# Patient Record
Sex: Female | Born: 1990 | Race: Black or African American | Hispanic: No | Marital: Single
Health system: Midwestern US, Community
[De-identification: ages and names within clinical notes are randomized; demographics above are authoritative.]

---

## 2010-06-02 LAB — URINALYSIS W/ RFLX MICROSCOPIC
Bilirubin: NEGATIVE
Blood: NEGATIVE
Glucose: NEGATIVE MG/DL
Ketone: NEGATIVE MG/DL
Nitrites: NEGATIVE
Protein: NEGATIVE MG/DL
Specific gravity: >1.03 — ABNORMAL HIGH (ref 1.003–1.030)
Urobilinogen: 0.2 EU/DL (ref 0.2–1.0)
pH (UA): 6 (ref 5.0–8.0)

## 2010-06-03 LAB — URINE MICROSCOPIC ONLY
RBC: NEGATIVE /HPF (ref 0–5)
WBC: 20 /HPF (ref 0–5)

## 2010-06-03 LAB — WET PREP

## 2010-06-06 LAB — CHLAMYDIA/GC PCR
Chlamydia amplified: NEGATIVE
N. gonorrhea, amplified: NEGATIVE

## 2010-08-11 LAB — URINALYSIS W/ RFLX MICROSCOPIC
Bilirubin: NEGATIVE
Blood: NEGATIVE
Glucose: NEGATIVE MG/DL
Ketone: NEGATIVE MG/DL
Leukocyte Esterase: NEGATIVE
Nitrites: NEGATIVE
Protein: NEGATIVE MG/DL
Specific gravity: 1.025 (ref 1.003–1.030)
Urobilinogen: 0.2 EU/DL (ref 0.2–1.0)
pH (UA): 7 (ref 5.0–8.0)

## 2010-08-11 LAB — WET PREP

## 2010-08-12 LAB — CHLAMYDIA/GC PCR
Chlamydia amplified: NEGATIVE
N. gonorrhea, amplified: NEGATIVE

## 2010-12-08 LAB — METABOLIC PANEL, BASIC
Anion gap: 8 mmol/L (ref 5–15)
BUN/Creatinine ratio: 10 — ABNORMAL LOW (ref 12–20)
BUN: 8 MG/DL (ref 7–18)
CO2: 24 MMOL/L (ref 21–32)
Calcium: 9 MG/DL (ref 8.4–10.4)
Chloride: 102 MMOL/L (ref 100–108)
Creatinine: 0.8 MG/DL (ref 0.6–1.3)
GFR est AA: >60 mL/min/{1.73_m2} (ref >60)
GFR est non-AA: >60 mL/min/{1.73_m2} (ref >60)
Glucose: 76 MG/DL (ref 74–99)
Potassium: 3.8 MMOL/L (ref 3.5–5.5)
Sodium: 134 MMOL/L — ABNORMAL LOW (ref 136–145)

## 2010-12-08 LAB — CBC WITH AUTOMATED DIFF
ABS. BASOPHILS: 0 10*3/uL (ref 0.0–0.06)
ABS. EOSINOPHILS: 0.3 10*3/uL (ref 0.0–0.4)
ABS. LYMPHOCYTES: 1.8 10*3/uL (ref 0.9–3.6)
ABS. MONOCYTES: 0.4 10*3/uL (ref 0.05–1.2)
ABS. NEUTROPHILS: 2.8 10*3/uL (ref 1.8–8.0)
BASOPHILS: 0 % (ref 0–2)
EOSINOPHILS: 6 % — ABNORMAL HIGH (ref 0–5)
HCT: 37.8 % (ref 35.0–45.0)
HGB: 12.3 g/dL (ref 12.0–16.0)
LYMPHOCYTES: 34 % (ref 21–52)
MCH: 26.8 PG (ref 24.0–34.0)
MCHC: 32.5 g/dL (ref 31.0–37.0)
MCV: 82.4 FL (ref 74.0–97.0)
MONOCYTES: 7 % (ref 3–10)
MPV: 9.2 FL (ref 9.2–11.8)
NEUTROPHILS: 53 % (ref 40–73)
PLATELET: 251 10*3/uL (ref 135–420)
RBC: 4.59 M/uL (ref 4.20–5.30)
RDW: 15 % — ABNORMAL HIGH (ref 11.6–14.5)
WBC: 5.3 10*3/uL (ref 4.6–13.2)

## 2010-12-08 LAB — PROTHROMBIN TIME + INR
INR: 1 (ref 0.0–1.2)
Prothrombin time: 13 s (ref 11.5–15.2)

## 2010-12-08 LAB — PTT: aPTT: 25 s (ref 24.6–37.7)

## 2010-12-08 LAB — BLOOD TYPE, (ABO+RH)
ABO/Rh(D): O POS
ABO/Rh: O POS

## 2010-12-08 LAB — BETA HCG, QT
Beta HCG, QT: 66214 m[IU]/mL — ABNORMAL HIGH (ref 1–6)
hCG Quant: 66214 m[IU]/mL — ABNORMAL HIGH (ref 1–6)

## 2010-12-08 NOTE — ED Provider Notes (Signed)
HPI Comments: 20 yo F presents to ED via C/O vaginal bleeding. Pt states she noticed some vaginal spotting approx 4 hours PTA while using the bathroom. Pt reports being gravid, LMP started 10/30/2010, G1P0A0. Pt describes some occasional pains in both flanks and her low back throughout pregnancy. Pt denies any focal abd pain. Pt denies having OB-Gyn visit or prior ultrasound. Pt reports taking pre-natal vitamins. Pt reports stopping smoking with pregnancy. Pt's Hx includes asthma. Pt denies dysuria, N/V/D and any other Sx or complaints.    Documented by Ronni Rumble, ED Scribe, for Amalia Greenhouse, MD      Patient is a 20 y.o. female presenting with non-menstrual bleeding. The history is provided by the patient.   Vaginal Bleeding  Primary symptoms include vaginal bleeding.  Primary symptoms include no discharge. There has been no fever. This is a new problem. The current episode started 3 to 5 hours ago. She is pregnant.        Past Medical History   Diagnosis Date   ??? Asthma         No past surgical history on file.      No family history on file.     History     Social History   ??? Marital Status: Single     Spouse Name: N/A     Number of Children: N/A   ??? Years of Education: N/A     Occupational History   ??? Not on file.     Social History Main Topics   ??? Smoking status: Former Smoker     Quit date: 09/22/2010   ??? Smokeless tobacco: Not on file    Comment: encouraged to svoid smoking    ??? Alcohol Use:    ??? Drug Use:    ??? Sexually Active: Yes -- Female partner(s)     Other Topics Concern   ??? Not on file     Social History Narrative   ??? No narrative on file                  ALLERGIES: Review of patient's allergies indicates no known allergies.      Review of Systems   Constitutional: Negative.    HENT: Negative.    Eyes: Negative.    Respiratory: Negative.    Cardiovascular: Negative.    Gastrointestinal: Negative.    Genitourinary: Positive for vaginal bleeding and non-menstrual bleeding.        [See HPI   Musculoskeletal: Positive for back pain.        [See HPI  Skin: Negative.    Neurological: Negative.    Hematological: Negative.    Psychiatric/Behavioral: Negative.    [all other systems reviewed and are negative    Documented by Ronni Rumble, ED Scribe, for Amalia Greenhouse, MD      Filed Vitals:    12/08/10 1530 12/08/10 1622   BP: 114/77    Pulse: 94    Temp: 98.2 ??F (36.8 ??C)    Resp: 16    Height:  5\' 2"  (1.575 m)   Weight:  120 lb (54.432 kg)   SpO2: 97%             Physical Exam   [nursing notereviewed.  Constitutional: She is oriented to person, place, and time. She appears well-developed and well-nourished. No distress.   HENT:   Head: Normocephalic and atraumatic.   Right Ear: External ear normal.   Left Ear: External ear normal.   Mouth/Throat: Oropharynx  is clear and moist.   Eyes: Conjunctivae and EOM are normal. Pupils are equal, round, and reactive to light.   Neck: Normal range of motion. Neck supple.   Cardiovascular: Normal rate, regular rhythm and normal heart sounds.    Pulmonary/Chest: Effort normal and breath sounds normal. No respiratory distress.   Abdominal: Soft. Bowel sounds are normal. There is no tenderness.   Genitourinary:        Brownish discharge noted. Cervix was closed.  There were no adnexal masses or pain.       Musculoskeletal: Normal range of motion. She exhibits no edema.   Neurological: She is alert and oriented to person, place, and time. No cranial nerve deficit.   Skin: Skin is warm and dry. No erythema.   Psychiatric: She has a normal mood and affect. Her behavior is normal.     Documented by Ronni Rumble, ED Scribe, for Amalia Greenhouse, MD       MDM     Amount and/or Complexity of Data Reviewed:   Clinical lab tests:  [ordered and reviewed  Tests in the radiology section of CPT??:  [ordered and reviewed    Documented by Ronni Rumble, ED Scribe, for Amalia Greenhouse, MD      Procedures    Answered patient questions regarding Tx.     6:28 PM   Procedure Note - Bedside Ultrasound:  6:28 PM  Performed by: Amalia Greenhouse, MD  US performed at beside to uterus. US showed approx 6 week fetus. Fetal heart tones visualized 120/min.   The procedure took 1-15 minutes, and pt tolerated well.   Written by Cristy Hilts, ED Scribe, as dictated by Amalia Greenhouse, MD  .    Procedure Note - Pelvic Exam:    Performed by: Amalia Greenhouse, MD  Pelvic exam was performed using bimanual and speculum. Further findings noted in physical exam.   The procedure took 1-15 minutes, and pt tolerated well. GC Chlamydia and Wet prep cultures obtained  Written by Cristy Hilts, ED Scribe, as dictated by Amalia Greenhouse, MD    DISCHARGE NOTE:   8:23 PM  Pt has been reexamined.  Patient has no new complaints, changes, or physical findings.  Care plan outlined and precautions discussed.  Results of labs and ultrasound were reviewed with the patient and her mother. All medications were reviewed with the patient. All of pt's questions and concerns were addressed. Patient was instructed and agrees to follow up with OB-GYN, as well as to return to the ED upon further deterioration. Patient is ready to go home.  Written by Cristy Hilts, ED Scribe, as dictated by Amalia Greenhouse, MD    .

## 2010-12-08 NOTE — ED Notes (Signed)
G1 P0 Ab0, LMP April 8th - normal. Positive pregnancy test at [facility] two days ago. Presents with complaint of low back discomfort and vaginal bleeding (spotting), using pad - not saturated.

## 2010-12-08 NOTE — ED Notes (Signed)
I have reviewed discharge instructions with the patient.  The patient verbalized understanding.  Patient armband removed and shredded

## 2010-12-09 LAB — CHLAMYDIA/GC PCR
Chlamydia amplified: NEGATIVE
N. gonorrhea, amplified: NEGATIVE

## 2010-12-09 LAB — WET PREP

## 2016-11-21 ENCOUNTER — Emergency Department (HOSPITAL_COMMUNITY)
Admission: EM | Admit: 2016-11-21 | Discharge: 2016-11-21 | Disposition: A | Discharge status: Home / Self Care | Payer: No Typology Code available for payment source | Source: Home / Self Care | Attending: Emergency Medicine | Admitting: Emergency Medicine

## 2016-11-21 ENCOUNTER — Emergency Department (HOSPITAL_COMMUNITY): Payer: No Typology Code available for payment source

## 2016-11-21 ENCOUNTER — Emergency Department (HOSPITAL_COMMUNITY)
Admission: EM | Admit: 2016-11-21 | Discharge: 2016-11-21 | Disposition: A | Discharge status: Home / Self Care | Payer: No Typology Code available for payment source | Attending: Emergency Medicine | Admitting: Emergency Medicine

## 2016-11-21 ENCOUNTER — Encounter (HOSPITAL_COMMUNITY): Payer: Self-pay | Admitting: Radiology

## 2016-11-21 ENCOUNTER — Encounter (HOSPITAL_COMMUNITY): Payer: Self-pay | Admitting: Emergency Medicine

## 2016-11-21 DIAGNOSIS — Z23 Encounter for immunization: Secondary | ICD-10-CM | POA: Insufficient documentation

## 2016-11-21 DIAGNOSIS — Z3A26 26 weeks gestation of pregnancy: Secondary | ICD-10-CM

## 2016-11-21 DIAGNOSIS — S060X0A Concussion without loss of consciousness, initial encounter: Secondary | ICD-10-CM | POA: Diagnosis not present

## 2016-11-21 DIAGNOSIS — Y9389 Activity, other specified: Secondary | ICD-10-CM

## 2016-11-21 DIAGNOSIS — R791 Abnormal coagulation profile: Secondary | ICD-10-CM | POA: Insufficient documentation

## 2016-11-21 DIAGNOSIS — Z3A19 19 weeks gestation of pregnancy: Secondary | ICD-10-CM | POA: Insufficient documentation

## 2016-11-21 DIAGNOSIS — O9A212 Injury, poisoning and certain other consequences of external causes complicating pregnancy, second trimester: Secondary | ICD-10-CM

## 2016-11-21 DIAGNOSIS — Y999 Unspecified external cause status: Secondary | ICD-10-CM | POA: Insufficient documentation

## 2016-11-21 DIAGNOSIS — S86911A Strain of unspecified muscle(s) and tendon(s) at lower leg level, right leg, initial encounter: Secondary | ICD-10-CM | POA: Diagnosis not present

## 2016-11-21 DIAGNOSIS — Y92410 Unspecified street and highway as the place of occurrence of the external cause: Secondary | ICD-10-CM | POA: Insufficient documentation

## 2016-11-21 DIAGNOSIS — R55 Syncope and collapse: Secondary | ICD-10-CM

## 2016-11-21 DIAGNOSIS — S40812A Abrasion of left upper arm, initial encounter: Secondary | ICD-10-CM | POA: Diagnosis not present

## 2016-11-21 LAB — COMPREHENSIVE METABOLIC PANEL
ALK PHOS: 68 U/L (ref 38–126)
ALT: 17 U/L (ref 14–54)
ANION GAP: 7 (ref 5–15)
AST: 27 U/L (ref 15–41)
Albumin: 3.1 g/dL — ABNORMAL LOW (ref 3.5–5.0)
BUN: <5 mg/dL — ABNORMAL LOW (ref 6–20)
CALCIUM: 8.8 mg/dL — AB (ref 8.9–10.3)
CHLORIDE: 105 mmol/L (ref 101–111)
CO2: 23 mmol/L (ref 22–32)
Creatinine, Ser: 0.64 mg/dL (ref 0.44–1.00)
Glucose, Bld: 110 mg/dL — ABNORMAL HIGH (ref 65–99)
Potassium: 3.4 mmol/L — ABNORMAL LOW (ref 3.5–5.1)
Sodium: 135 mmol/L (ref 135–145)
Total Bilirubin: 0.4 mg/dL (ref 0.3–1.2)
Total Protein: 6.1 g/dL — ABNORMAL LOW (ref 6.5–8.1)

## 2016-11-21 LAB — CBC
HCT: 33.7 % — ABNORMAL LOW (ref 36.0–46.0)
Hemoglobin: 10.7 g/dL — ABNORMAL LOW (ref 12.0–15.0)
MCH: 28 pg (ref 26.0–34.0)
MCHC: 31.8 g/dL (ref 30.0–36.0)
MCV: 88.2 fL (ref 78.0–100.0)
Platelets: 250 10*3/uL (ref 150–400)
RBC: 3.82 MIL/uL — AB (ref 3.87–5.11)
RDW: 14.3 % (ref 11.5–15.5)
WBC: 6.8 10*3/uL (ref 4.0–10.5)

## 2016-11-21 LAB — PROTIME-INR
INR: 0.94
PROTHROMBIN TIME: 12.5 s (ref 11.4–15.2)

## 2016-11-21 LAB — CBG MONITORING, ED: Glucose-Capillary: 94 mg/dL (ref 65–99)

## 2016-11-21 LAB — I-STAT CHEM 8, ED
BUN: <3 mg/dL — ABNORMAL LOW (ref 6–20)
CALCIUM ION: 1.15 mmol/L (ref 1.15–1.40)
CREATININE: 0.6 mg/dL (ref 0.44–1.00)
Chloride: 103 mmol/L (ref 101–111)
GLUCOSE: 106 mg/dL — AB (ref 65–99)
HCT: 32 % — ABNORMAL LOW (ref 36.0–46.0)
Hemoglobin: 10.9 g/dL — ABNORMAL LOW (ref 12.0–15.0)
POTASSIUM: 3.5 mmol/L (ref 3.5–5.1)
Sodium: 137 mmol/L (ref 135–145)
TCO2: 23 mmol/L (ref 0–100)

## 2016-11-21 LAB — URINALYSIS, ROUTINE W REFLEX MICROSCOPIC
BILIRUBIN URINE: NEGATIVE
Bacteria, UA: NONE SEEN
GLUCOSE, UA: NEGATIVE mg/dL
HGB URINE DIPSTICK: NEGATIVE
Ketones, ur: NEGATIVE mg/dL
NITRITE: NEGATIVE
PH: 6 (ref 5.0–8.0)
Protein, ur: NEGATIVE mg/dL
SPECIFIC GRAVITY, URINE: 1.01 (ref 1.005–1.030)

## 2016-11-21 LAB — ETHANOL

## 2016-11-21 LAB — I-STAT CG4 LACTIC ACID, ED: Lactic Acid, Venous: 1.99 mmol/L (ref 0.5–1.9)

## 2016-11-21 LAB — ABO/RH: ABO/RH(D): O POS

## 2016-11-21 LAB — CDS SEROLOGY

## 2016-11-21 MED ORDER — FENTANYL CITRATE (PF) 100 MCG/2ML IJ SOLN
INTRAMUSCULAR | Status: AC | PRN
  Administered 2016-11-21: 25 ug via INTRAVENOUS

## 2016-11-21 MED ORDER — ACETAMINOPHEN 325 MG PO TABS
650.0000 mg | ORAL_TABLET | Freq: Once | ORAL | Status: AC
  Administered 2016-11-21: 650 mg via ORAL
  Filled 2016-11-21: qty 2

## 2016-11-21 MED ORDER — FENTANYL CITRATE (PF) 100 MCG/2ML IJ SOLN
25.0000 ug | Freq: Once | INTRAMUSCULAR | Status: AC
  Administered 2016-11-21: 25 ug via INTRAVENOUS
  Filled 2016-11-21: qty 2

## 2016-11-21 MED ORDER — SODIUM CHLORIDE 0.9 % IV BOLUS (SEPSIS)
1000.0000 mL | Freq: Once | INTRAVENOUS | Status: AC
  Administered 2016-11-21: 1000 mL via INTRAVENOUS

## 2016-11-21 MED ORDER — TETANUS-DIPHTH-ACELL PERTUSSIS 5-2.5-18.5 LF-MCG/0.5 IM SUSP
0.5000 mL | Freq: Once | INTRAMUSCULAR | Status: AC
  Administered 2016-11-21: 0.5 mL via INTRAMUSCULAR
  Filled 2016-11-21: qty 0.5

## 2016-11-21 MED ORDER — MECLIZINE HCL 25 MG PO TABS
25.0000 mg | ORAL_TABLET | Freq: Once | ORAL | Status: AC
Start: 2016-11-21 — End: 2016-11-21
  Administered 2016-11-21: 25 mg via ORAL
  Filled 2016-11-21: qty 1
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
## 2016-11-21 NOTE — Discharge Instructions (Signed)
Get help right away if: You have severe or worsening headaches. You have weakness or numbness in any part of your body. Your coordination gets worse. You vomit repeatedly. You are sleepier. The pupil of one eye is larger than the other. You have convulsions or a seizure. Your speech is slurred. Your fatigue, confusion, or irritability gets worse. You cannot recognize people or places. You have neck pain. It is difficult to wake you up. You have unusual behavior changes. You lose consciousness.  Return to the emergency department immediately if you develop any of the following symptoms: You have numbness, tingling, or weakness in the arms or legs. You develop severe headaches not relieved with medicine. You have severe neck pain, especially tenderness in the middle of the back of your neck. You have changes in bowel or bladder control. There is increasing pain in any area of the body. You have shortness of breath, light-headedness, dizziness, or fainting. You have chest pain. You feel sick to your stomach (nauseous), throw up (vomit), or sweat. You have increasing abdominal discomfort. There is blood in your urine, stool, or vomit. You have pain in your shoulder (shoulder strap areas). You feel your symptoms are getting worse.
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
## 2016-11-21 NOTE — ED Notes (Signed)
Pt ambulated self efficiently in hallway with little difficulty. Pt complaining of Right leg pain to the chin.

## 2016-11-21 NOTE — ED Notes (Signed)
OB rapid called at time of Activation.
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
## 2016-11-21 NOTE — ED Notes (Signed)
ED PA at bedside updating family.
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
## 2016-11-21 NOTE — ED Triage Notes (Signed)
Patient just discharged, now with syncopal feeling while waiting to be picked up by family.  Patient is [redacted] weeks pregnant.

## 2016-11-21 NOTE — ED Notes (Signed)
Pt states she is ready to get up and leave. Pt ambulated to bathroom. Reports dizziness is much improved.

## 2016-11-21 NOTE — ED Notes (Signed)
Breakfast tray ordered per PA.

## 2016-11-21 NOTE — ED Notes (Signed)
OB rapid called at time of Activation.  

## 2016-11-21 NOTE — ED Notes (Signed)
Dressing changed on left hand per patient request.

## 2017-06-20 ENCOUNTER — Encounter (HOSPITAL_COMMUNITY): Payer: Self-pay

## 2017-10-27 ENCOUNTER — Emergency Department: Admit: 2017-10-27 | Payer: Medicaid - Out of State

## 2017-10-27 ENCOUNTER — Inpatient Hospital Stay
Admit: 2017-10-27 | Discharge: 2017-10-27 | Disposition: A | Discharge status: Home / Self Care | Payer: Medicaid - Out of State | Attending: Emergency Medicine

## 2017-10-27 DIAGNOSIS — J029 Acute pharyngitis, unspecified: Secondary | ICD-10-CM

## 2017-10-27 LAB — POC URINE MACROSCOPIC
Blood: NEGATIVE
Glucose: NEGATIVE mg/dl
Leukocyte Esterase: NEGATIVE
Nitrites: NEGATIVE
Protein: 30 mg/dl — AB
Specific gravity: 1.025 (ref 1.005–1.030)
Urobilinogen: 2 EU/dl — ABNORMAL HIGH (ref 0.0–1.0)
pH (UA): 7 (ref 5–9)

## 2017-10-27 LAB — STREP GRP A BY PCR: Group A strep by PCR: NEGATIVE

## 2017-10-27 LAB — POC HCG,URINE: HCG urine, QL: NEGATIVE

## 2017-10-27 MED ORDER — ONDANSETRON 4 MG TAB, RAPID DISSOLVE
4 mg | ORAL | Status: AC
Start: 2017-10-27 — End: 2017-10-27
  Administered 2017-10-27: 07:00:00 via SUBLINGUAL

## 2017-10-27 MED ORDER — HYDROCODONE 10 MG-CHLORPHENIRAMINE 8 MG/5 ML ORAL SUSP EXTEND.REL 12HR
10-8 mg/5 mL | ORAL | Status: AC
Start: 2017-10-27 — End: 2017-10-27
  Administered 2017-10-27: 07:00:00 via ORAL

## 2017-10-27 MED ORDER — IBUPROFEN 600 MG TAB
600 mg | ORAL_TABLET | Freq: Four times a day (QID) | ORAL | 0 refills | Status: AC | PRN
Start: 2017-10-27 — End: ?

## 2017-10-27 MED ORDER — BENZONATATE 100 MG CAP
100 mg | ORAL_CAPSULE | Freq: Three times a day (TID) | ORAL | 0 refills | Status: AC | PRN
Start: 2017-10-27 — End: 2017-11-03

## 2017-10-27 MED ORDER — DEXAMETHASONE SODIUM PHOSPHATE 10 MG/ML IJ SOLN
10 mg/mL | INTRAMUSCULAR | Status: AC
Start: 2017-10-27 — End: 2017-10-27
  Administered 2017-10-27: 06:00:00 via ORAL

## 2017-10-27 MED FILL — HYDROCODONE 10 MG-CHLORPHENIRAMINE 8 MG/5 ML ORAL SUSP EXTEND.REL 12HR: 10-8 mg/5 mL | ORAL | Qty: 5

## 2017-10-27 MED FILL — DEXAMETHASONE SODIUM PHOSPHATE 10 MG/ML IJ SOLN: 10 mg/mL | INTRAMUSCULAR | Qty: 1

## 2017-10-27 MED FILL — ONDANSETRON 4 MG TAB, RAPID DISSOLVE: 4 mg | ORAL | Qty: 1

## 2017-10-27 NOTE — ED Triage Notes (Signed)
C/o nausea, vomiting x2 days    C/o (dry/non-productive) cough and sore throat x 5 days.    LMP: states that she takes Depo last in Feb. (irregular-her normal)

## 2017-10-27 NOTE — ED Notes (Signed)
2:50 AM  10/27/17     Discharge instructions given to Brandy Adams (name) with verbalization of understanding. Patient accompanied by friend.  Patient discharged with the following prescriptions tessalon and motrin. Patient discharged to home (destination).      Addison BaileyStephanie Parker, RN

## 2017-10-27 NOTE — ED Provider Notes (Signed)
[facility]  Emergency Department Treatment Report    Patient: Brandy Adams Age: 27 y.o. Sex: female    Date of Birth: 10-26-1990 Admit Date: 10/27/2017 PCP: None   MRN: [id number]  CSN: [id number]  Attending: Smitty Cords, MD   Room: [phone] Time Dictated: 1:20 AM APP:  Dan Humphreys, PA-C     Chief Complaint   Vomiting, sore throat, cough   History of Present Illness   27 y.o. female complaining of nonproductive cough for the past 5 days.  She has had several episodes of posttussive emesis.  Denies hemoptysis or hematemesis.  She also complains of sore throat for the past 3 days.  She has been eating and drinking like normal.  Denies fever/chills, runny nose, nasal congestion, body aches, shortness of breath or difficulty breathing.  Denies abdominal pain.  She does complain of achy chest pain with coughing exacerbation; denies chest pain at rest or pleuritic pain.  She was seen in urgent care on Monday and told that she had the flu.  Denies alleviating factors of symptoms however has not tried any over-the-counter medications.    Review of Systems     Constitutional: No fever, chills  ENT: Positive sore throat, denies runny nose/nasal congestion  Respiratory: Positive cough, no dyspnea or wheezing  Cardiovascular: Positive chest pain with cough  Gastrointestinal: Positive posttussive emesis, no abdominal pain  Musculoskeletal: No lower extremity swelling, no body aches   Integumentary: No rashes.  Neurological: No headaches  Denies complaints in all other systems.    Past Medical/Surgical History     Past Medical History:   Diagnosis Date   ??? Asthma      History reviewed. No pertinent surgical history.    Social History     Social History     Socioeconomic History   ??? Marital status: SINGLE     Spouse name: Not on file   ??? Number of children: Not on file   ??? Years of education: Not on file   ??? Highest education level: Not on file   Tobacco Use   ??? Smoking status: Former Smoker      Last attempt to quit: 09/22/2010     Years since quitting: 7.1   ??? Smokeless tobacco: Never Used   ??? Tobacco comment: encouraged to svoid smoking    Substance and Sexual Activity   ??? Alcohol use: Not Currently     Frequency: Never   ??? Drug use: Never   ??? Sexual activity: Yes     Partners: Male       Family History   History reviewed. No pertinent family history.    Home Medications     None       Allergies   Allergies no known allergies    Physical Exam     ED Triage Vitals   ED Encounter Vitals Group      BP 10/27/17 0016 114/84      Pulse (Heart Rate) 10/27/17 0016 91      Resp Rate 10/27/17 0016 20      Temp 10/27/17 0016 98.4 ??F (36.9 ??C)      Temp src --       O2 Sat (%) 10/27/17 0016 97 %      Weight 10/27/17 0013 120 lb      Height 10/27/17 0013 5\' 2"        Constitutional: Patient appears well developed and well nourished. Appearance and behavior are age and situation appropriate.  HEENT: Conjunctiva clear. PERRLA. Mucous membranes moist, non-erythematous.  Diffuse erythema to the posterior pharynx, mild bilateral enlargement of the tonsils, no exudate, airway is patent.  The surface of the palate and tongue are pink, moist and without lesions.  Neck: supple, non tender, symmetrical, no masses or JVD. No lymphadenopathy.  No nuchal rigidity.  Respiratory: lungs clear to auscultation, nonlabored respirations. No tachypnea or accessory muscle use.  Cardiovascular: heart regular rate and rhythm without murmur rubs or gallops.    Gastrointestinal:  Abdomen soft, nondistended, nontender without complaint of pain to palpation   Integumentary: warm and dry without rashes or lesions  Neurologic: alert and oriented. No facial asymmetry or dysarthria. Moving all extremities well    Impression and Management Plan   27 y.o. female presents complaining of cough and sore throat.  Patient is afebrile and her vital signs are stable.  Patient overall well-appearing,  nontoxic.  She does have diffuse erythema to the posterior pharynx, rest of her exam is unremarkable.  Lung sounds are clear to auscultation and no signs of respiratory distress.    Diagnostic Studies   Lab:   Recent Results (from the past 12 hour(s))   POC URINE MACROSCOPIC    Collection Time: 10/27/17 12:51 AM   Result Value Ref Range    Glucose Negative NEGATIVE,Negative mg/dl    Bilirubin Small (A) NEGATIVE,Negative      Ketone Trace (A) NEGATIVE,Negative mg/dl    Specific gravity 6.0451.025 1.005 - 1.030      Blood Negative NEGATIVE,Negative      pH (UA) 7.0 5 - 9      Protein 30 (A) NEGATIVE,Negative mg/dl    Urobilinogen 2.0 (H) 0.0 - 1.0 EU/dl    Nitrites Negative NEGATIVE,Negative      Leukocyte Esterase Negative NEGATIVE,Negative      Color Yellow      Appearance Clear     POC HCG,URINE    Collection Time: 10/27/17 12:52 AM   Result Value Ref Range    HCG urine, QL negative NEGATIVE,Negative,negative     STREP GRP A BY PCR    Collection Time: 10/27/17  1:20 AM   Result Value Ref Range    Group A strep by PCR  Negative - No Streptococcus Group A DNA was Detected.       Negative - No Streptococcus Group A DNA was Detected.       Imaging:    Chest x-ray shows no acute abnormalities as read by ED physician Dr. Vinnie LangtonErik Kisa  ED Course/Medical Decision Making   Patient remained stable throughout her stay in the ED with no new or worsening symptoms.  I have reviewed the results with the patient showing no acute concerns.  I suspect viral cough with pharyngitis.  Patient was given oral Decadron 10 mg which she tolerated well.  We will discharge home with supportive treatment and she is encouraged to rest and drink plenty fluids and stay well-hydrated.  Patient encouraged Tylenol and ibuprofen as directed as needed for fever/pain.  Patient encouraged to follow-up with her primary care physician.  Patient advised to return to the ED for new or worsening symptoms.  Medications    dexamethasone (DECADRON) Oral 10 mg (10 mg Oral Given 10/27/17 0157)     Final Diagnosis        ICD-10-CM ICD-9-CM   1. Cough R05 786.2   2. Acute pharyngitis, unspecified etiology J02.9 462       Disposition   Patient stable for discharge  home    Current Discharge Medication List      START taking these medications    Details   benzonatate (TESSALON PERLES) 100 mg capsule Take 1 Cap by mouth three (3) times daily as needed for Cough for up to 7 days.  Qty: 30 Cap, Refills: 0      ibuprofen (MOTRIN) 600 mg tablet Take 1 Tab by mouth every six (6) hours as needed for Pain.  Qty: 20 Tab, Refills: 0           The patient was fully discussed with KISA, Wetzel Bjornstad, MD who agrees with the above assessment and plan      Luna Fuse PA-C  October 27, 2017    My signature above authenticates this document and my orders, the final ??  diagnosis (es), discharge prescription (s), and instructions in the Epic ??  record.  If you have any questions please contact [phone].  ??  Nursing notes have been reviewed by the physician/ advanced practice ??  Clinician.
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
